# Patient Record
Sex: Male | Born: 2005 | Race: White | Hispanic: No | Marital: Single | Smoking: Never smoker
Health system: Southern US, Community
[De-identification: ages and names within clinical notes are randomized; demographics above are authoritative.]

---
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
## 2023-07-27 DIAGNOSIS — Z0000 Encounter for general adult medical examination without abnormal findings: Secondary | ICD-10-CM | POA: Diagnosis not present

## 2023-07-27 DIAGNOSIS — L989 Disorder of the skin and subcutaneous tissue, unspecified: Secondary | ICD-10-CM | POA: Diagnosis not present

## 2023-07-27 DIAGNOSIS — Z23 Encounter for immunization: Secondary | ICD-10-CM | POA: Diagnosis not present

## 2023-07-27 DIAGNOSIS — Z1331 Encounter for screening for depression: Secondary | ICD-10-CM | POA: Diagnosis not present

## 2023-07-27 DIAGNOSIS — R82998 Other abnormal findings in urine: Secondary | ICD-10-CM | POA: Diagnosis not present

## 2023-09-09 DIAGNOSIS — B079 Viral wart, unspecified: Secondary | ICD-10-CM | POA: Diagnosis not present

## 2023-09-09 DIAGNOSIS — D225 Melanocytic nevi of trunk: Secondary | ICD-10-CM | POA: Diagnosis not present
